# Patient Record
Sex: Male | Born: 1995 | Race: Black or African American | Hispanic: No | Marital: Single | Smoking: Current every day smoker
Health system: Southern US, Community
[De-identification: ages and names within clinical notes are randomized; demographics above are authoritative.]

## PROBLEM LIST (undated history)

## (undated) DIAGNOSIS — T148XXA Other injury of unspecified body region, initial encounter: Secondary | ICD-10-CM

---

## 2014-06-09 ENCOUNTER — Other Ambulatory Visit (HOSPITAL_COMMUNITY)
Admission: RE | Admit: 2014-06-09 | Discharge: 2014-06-09 | Disposition: A | Discharge status: Home / Self Care | Payer: Self-pay | Source: Ambulatory Visit | Attending: Emergency Medicine | Admitting: Emergency Medicine

## 2014-06-09 ENCOUNTER — Emergency Department (INDEPENDENT_AMBULATORY_CARE_PROVIDER_SITE_OTHER)
Admission: EM | Admit: 2014-06-09 | Discharge: 2014-06-09 | Disposition: A | Discharge status: Home / Self Care | Payer: Self-pay | Source: Home / Self Care | Attending: Emergency Medicine | Admitting: Emergency Medicine

## 2014-06-09 ENCOUNTER — Encounter (HOSPITAL_COMMUNITY): Payer: Self-pay | Admitting: Emergency Medicine

## 2014-06-09 DIAGNOSIS — Z202 Contact with and (suspected) exposure to infections with a predominantly sexual mode of transmission: Secondary | ICD-10-CM

## 2014-06-09 DIAGNOSIS — Z113 Encounter for screening for infections with a predominantly sexual mode of transmission: Secondary | ICD-10-CM | POA: Insufficient documentation

## 2014-06-09 NOTE — ED Notes (Signed)
Pt states that he was told by a partner that he was exposed to Herpes.

## 2014-06-09 NOTE — ED Provider Notes (Signed)
CSN: [id number]     Arrival date & time 06/09/14  1203 History   First MD Initiated Contact with Patient 06/09/14 1349     Chief Complaint  Patient presents with  . Exposure to STD   (Consider location/radiation/quality/duration/timing/severity/associated sxs/prior Treatment) HPI Comments: Patient states he was informed by his current girlfriend that she might have herpes. He reports himself to be asymptomatic but [facility].  States he has been treated for chlamydia in the past.   Patient is a 19 y.o. male presenting with STD exposure. The history is provided by the patient.  Exposure to STD This is a new problem.    History reviewed. No pertinent past medical history. History reviewed. No pertinent past surgical history. History reviewed. No pertinent family history. History  Substance Use Topics  . Smoking status: Current Every Day Smoker -- 0.50 packs/day    Types: Cigarettes  . Smokeless tobacco: Not on file  . Alcohol Use: No    Review of Systems  All other systems reviewed and are negative.   Allergies  Review of patient's allergies indicates no known allergies.  Home Medications   Prior to Admission medications   Not on File   BP 123/73 mmHg  Pulse 51  Temp(Src) 98.6 F (37 C) (Oral)  Resp 15  SpO2 100% Physical Exam  Constitutional: He is oriented to person, place, and time. He appears well-developed and well-nourished. No distress.  HENT:  Head: Normocephalic and atraumatic.  Eyes: Conjunctivae are normal.  Cardiovascular: Normal rate.   Pulmonary/Chest: Effort normal.  Abdominal: There is no tenderness.  Genitourinary:  Declines exam  Musculoskeletal: Normal range of motion.  Neurological: He is alert and oriented to person, place, and time.  Skin: Skin is warm and dry.  Psychiatric: He has a normal mood and affect. His behavior is normal.  Nursing note and vitals reviewed.   ED Course  Procedures (including critical  care time) Labs Review Labs Reviewed  HIV ANTIBODY (ROUTINE TESTING)  RPR  HSV 2 ANTIBODY, IGG  HSV 1 ANTIBODY, IGG  URINE CYTOLOGY ANCILLARY ONLY    Imaging Review No results found.   MDM   1. Possible exposure to STD   Advised safe sex practices. Will notify patient if results indicate need for treatment. [facility] concerns   Ria ClockJennifer Birchall H Abbigael Detlefsen, [place] 06/09/14 1451

## 2014-06-09 NOTE — Discharge Instructions (Signed)
If any of your results are positive or indicate the need for treatment, you will be notified by phone.  Safe Sex Safe sex is about reducing the risk of giving or getting a sexually transmitted disease (STD). STDs are spread through sexual contact involving the genitals, mouth, or rectum. Some STDs can be cured and others cannot. Safe sex can also prevent unintended pregnancies.  WHAT ARE SOME SAFE SEX PRACTICES?  Limit your sexual activity to only one partner who is having sex with only you.  Talk to your partner about his or her past partners, past STDs, and drug use.  Use a condom every time you have sexual intercourse. This includes vaginal, oral, and anal sexual activity. Both females and males should wear condoms during oral sex. Only use latex or polyurethane condoms and water-based lubricants. Using petroleum-based lubricants or oils to lubricate a condom will weaken the condom and increase the chance that it will break. The condom should be in place from the beginning to the end of sexual activity. Wearing a condom reduces, but does not completely eliminate, your risk of getting or giving an STD. STDs can be spread by contact with infected body fluids and skin.  Get vaccinated for hepatitis B and HPV.  Avoid alcohol and recreational drugs, which can affect your judgment. You may forget to use a condom or participate in high-risk sex.  For females, avoid douching after sexual intercourse. Douching can spread an infection farther into the reproductive tract.  Check your body for signs of sores, blisters, rashes, or unusual discharge. See your health care provider if you notice any of these signs.  Avoid sexual contact if you have symptoms of an infection or are being treated for an STD. If you or your partner has herpes, avoid sexual contact when blisters are present. Use condoms at all other times.  If you are at risk of being infected with HIV, it is recommended that you take a  prescription medicine daily to prevent HIV infection. This is called pre-exposure prophylaxis (PrEP). You are considered at risk if:  You are a man who has sex with other men (MSM).  You are a heterosexual man or woman who is sexually active with more than one partner.  You take drugs by injection.  You are sexually active with a partner who has HIV.  Talk with your health care provider about whether you are at high risk of being infected with HIV. If you choose to begin PrEP, you should first be tested for HIV. You should then be tested every 3 months for as long as you are taking PrEP.  See your health care provider for regular screenings, exams, and tests for other STDs. Before having sex with a new partner, each of you should be screened for STDs and should talk about the results with each other. WHAT ARE THE BENEFITS OF SAFE SEX?   There is less chance of getting or giving an STD.  You can prevent unwanted or unintended pregnancies.  By discussing safe sex concerns with your partner, you may increase feelings of intimacy, comfort, trust, and honesty between the two of you. Document Released: 05/05/2004 Document Revised: 08/12/2013 Document Reviewed: 09/19/2011 [facility] Patient Information 2015 [place], [place]. This information is not intended to replace advice given to you by your health care provider. Make sure you discuss any questions you have with your health care provider.  Sexually Transmitted Disease A sexually transmitted disease (STD) is a disease or infection that may  be passed (transmitted) from person to person, usually during sexual activity. This may happen by way of saliva, semen, blood, vaginal mucus, or urine. Common STDs include:   Gonorrhea.   Chlamydia.   Syphilis.   HIV and AIDS.   Genital herpes.   Hepatitis B and C.   Trichomonas.   Human papillomavirus (HPV).   Pubic lice.   Scabies.  Mites.  Bacterial vaginosis. WHAT ARE CAUSES  OF STDs? An STD may be caused by bacteria, a virus, or parasites. STDs are often transmitted during sexual activity if one person is infected. However, they may also be transmitted through nonsexual means. STDs may be transmitted after:   Sexual intercourse with an infected person.   Sharing sex toys with an infected person.   Sharing needles with an infected person or using unclean piercing or tattoo needles.  Having intimate contact with the genitals, mouth, or rectal areas of an infected person.   Exposure to infected fluids during birth. WHAT ARE THE SIGNS AND SYMPTOMS OF STDs? Different STDs have different symptoms. Some people may not have any symptoms. If symptoms are present, they may include:   Painful or bloody urination.   Pain in the pelvis, abdomen, vagina, anus, throat, or eyes.   A skin rash, itching, or irritation.  Growths, ulcerations, blisters, or sores in the genital and anal areas.  Abnormal vaginal discharge with or without bad odor.   Penile discharge in men.   Fever.   Pain or bleeding during sexual intercourse.   Swollen glands in the groin area.   Yellow skin and eyes (jaundice). This is seen with hepatitis.   Swollen testicles.  Infertility.  Sores and blisters in the mouth. HOW ARE STDs DIAGNOSED? To make a diagnosis, your health care provider may:   Take a medical history.   Perform a physical exam.   Take a sample of any discharge to examine.  Swab the throat, cervix, opening to the penis, rectum, or vagina for testing.  Test a sample of your first morning urine.   Perform blood tests.   Perform a Pap test, if this applies.   Perform a colposcopy.   Perform a laparoscopy.  HOW ARE STDs TREATED? Treatment depends on the STD. Some STDs may be treated but not cured.   Chlamydia, gonorrhea, trichomonas, and syphilis can be cured with antibiotic medicine.   Genital herpes, hepatitis, and HIV can be treated,  but not cured, with prescribed medicines. The medicines lessen symptoms.   Genital warts from HPV can be treated with medicine or by freezing, burning (electrocautery), or surgery. Warts may come back.   HPV cannot be cured with medicine or surgery. However, abnormal areas may be removed from the cervix, vagina, or vulva.   If your diagnosis is confirmed, your recent sexual partners need treatment. This is true even if they are symptom-free or have a negative culture or evaluation. They should not have sex until their health care providers say it is okay. HOW CAN I REDUCE MY RISK OF GETTING AN STD? Take these steps to reduce your risk of getting an STD:  Use latex condoms, dental dams, and water-soluble lubricants during sexual activity. Do not use petroleum jelly or oils.  Avoid having multiple sex partners.  Do not have sex with someone who has other sex partners.  Do not have sex with anyone you do not know or who is at high risk for an STD.  Avoid risky sex practices that can break your  skin.  Do not have sex if you have open sores on your mouth or skin.  Avoid drinking too much alcohol or taking illegal drugs. Alcohol and drugs can affect your judgment and put you in a vulnerable position.  Avoid engaging in oral and anal sex acts.  Get vaccinated for HPV and hepatitis. If you have not received these vaccines in the past, talk to your health care provider about whether one or both might be right for you.   If you are at risk of being infected with HIV, it is recommended that you take a prescription medicine daily to prevent HIV infection. This is called pre-exposure prophylaxis (PrEP). You are considered at risk if:  You are a man who has sex with other men (MSM).  You are a heterosexual man or woman and are sexually active with more than one partner.  You take drugs by injection.  You are sexually active with a partner who has HIV.  Talk with your health care provider  about whether you are at high risk of being infected with HIV. If you choose to begin PrEP, you should first be tested for HIV. You should then be tested every 3 months for as long as you are taking PrEP.  WHAT SHOULD I DO IF I THINK I HAVE AN STD?  See your health care provider.   Tell your sexual partner(s). They should be tested and treated for any STDs.  Do not have sex until your health care provider says it is okay. WHEN SHOULD I GET IMMEDIATE MEDICAL CARE? Contact your health care provider right away if:   You have severe abdominal pain.  You are a man and notice swelling or pain in your testicles.  You are a woman and notice swelling or pain in your vagina. Document Released: 06/18/2002 Document Revised: 04/02/2013 Document Reviewed: 10/16/2012 [facility] Patient Information 2015 [place], [place]. This information is not intended to replace advice given to you by your health care provider. Make sure you discuss any questions you have with your health care provider.

## 2014-06-10 LAB — HSV 2 ANTIBODY, IGG: HSV 2 Glycoprotein G Ab, IgG: <0.91 index (ref 0.00–0.90)

## 2014-06-10 LAB — RPR: RPR Ser Ql: NONREACTIVE

## 2014-06-10 LAB — HIV ANTIBODY (ROUTINE TESTING W REFLEX): HIV Screen 4th Generation wRfx: NONREACTIVE

## 2014-06-10 LAB — HSV 1 ANTIBODY, IGG: HSV 1 Glycoprotein G Ab, IgG: >62.2 index — ABNORMAL HIGH (ref 0.00–0.90)

## 2014-06-11 ENCOUNTER — Telehealth (HOSPITAL_COMMUNITY): Payer: Self-pay | Admitting: *Deleted

## 2014-06-11 LAB — URINE CYTOLOGY ANCILLARY ONLY
Chlamydia: NEGATIVE
Neisseria Gonorrhea: NEGATIVE
Trichomonas: NEGATIVE

## 2014-06-11 NOTE — ED Notes (Addendum)
Pt. called for his lab results.  Pt. verified x 2 and given results.  Pt. 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  Pt.'s questions about this answered. I told him I would only call back if Cytology tests are pos. Vassie MoselleYork, Jaimee Corum M 06/11/2014 GC/Chlamydia and Trich neg. 06/11/2014

## 2014-10-21 ENCOUNTER — Emergency Department (HOSPITAL_COMMUNITY)
Admission: EM | Admit: 2014-10-21 | Discharge: 2014-10-21 | Disposition: A | Discharge status: Home / Self Care | Payer: Self-pay | Attending: Emergency Medicine | Admitting: Emergency Medicine

## 2014-10-21 ENCOUNTER — Encounter (HOSPITAL_COMMUNITY): Payer: Self-pay | Admitting: Emergency Medicine

## 2014-10-21 DIAGNOSIS — L02416 Cutaneous abscess of left lower limb: Secondary | ICD-10-CM | POA: Insufficient documentation

## 2014-10-21 DIAGNOSIS — Z720 Tobacco use: Secondary | ICD-10-CM | POA: Insufficient documentation

## 2014-10-21 MED ORDER — LIDOCAINE-EPINEPHRINE (PF) 2 %-1:200000 IJ SOLN
10.0000 mL | Freq: Once | INTRAMUSCULAR | Status: AC
  Administered 2014-10-21: 10 mL
  Filled 2014-10-21: qty 20

## 2014-10-21 NOTE — Discharge Instructions (Signed)
Your packing may be removed Thursday morning, this is easier to do while in the shower.  Watch for signs of worsening:  Increasing redness, worsening drainage, or pain.   Abscess Care After An abscess (also called a boil or furuncle) is an infected area that contains a collection of pus. Signs and symptoms of an abscess include pain, tenderness, redness, or hardness, or you may feel a moveable soft area under your skin. An abscess can occur anywhere in the body. The infection may spread to surrounding tissues causing cellulitis. A cut (incision) by the surgeon was made over your abscess and the pus was drained out. Gauze may have been packed into the space to provide a drain that will allow the cavity to heal from the inside outwards. The boil may be painful for 5 to 7 days. Most people with a boil do not have high fevers. Your abscess, if seen early, may not have localized, and may not have been lanced. If not, another appointment may be required for this if it does not get better on its own or with medications. HOME CARE INSTRUCTIONS   Only take over-the-counter or prescription medicines for pain, discomfort, or fever as directed by your caregiver.  When you bathe on Thursday, soak and then remove gauze or iodoform packs at least daily or as directed by your caregiver. You may then wash the wound gently with mild soapy water. Repack with gauze or do as your caregiver directs. SEEK IMMEDIATE MEDICAL CARE IF:   You develop increased pain, swelling, redness, drainage, or bleeding in the wound site.  You develop signs of generalized infection including muscle aches, chills, fever, or a general ill feeling.  An oral temperature above 102 F (38.9 C) develops, not controlled by medication. See your caregiver for a recheck if you develop any of the symptoms described above. If medications (antibiotics) were prescribed, take them as directed. Document Released: 10/14/2004 Document Revised: 06/20/2011  Document Reviewed: 06/11/2007 [facility] Patient Information 2015 [place], [place]. This information is not intended to replace advice given to you by your health care provider. Make sure you discuss any questions you have with your health care provider.

## 2014-10-21 NOTE — ED Notes (Signed)
Dr. Otter at the bedside.  

## 2014-10-21 NOTE — ED Provider Notes (Signed)
CSN: [id number]     Arrival date & time 10/21/14  0050 History  This chart was scribed for Marisa Severinlga Mamoru Takeshita, MD by Abel PrestoKara Demonbreun, ED Scribe. This patient was seen in room D32C/D32C and the patient's care was started at 2:56 AM.    Chief Complaint  Patient presents with  . Abscess      The history is provided by the patient. No language interpreter was used.  HPI Comments: Roger Lane is a 19 y.o. male who presents to the Emergency Department complaining of boil to left upper thigh with onset 7 days ago. Pt states boil produced a whitehead which he popped. He reports worsening swelling and tenderness to area since. He states sitting aggravates the pain. Pt denies fever and chills.    History reviewed. No pertinent past medical history. History reviewed. No pertinent past surgical history. No family history on file. History  Substance Use Topics  . Smoking status: Current Every Day Smoker -- 0.00 packs/day    Types: Cigarettes  . Smokeless tobacco: Not on file  . Alcohol Use: No    Review of Systems  Constitutional: Negative for fever and chills.  Skin:       lesion      Allergies  Review of patient's allergies indicates no known allergies.  Home Medications   Prior to Admission medications   Not on File   BP 134/60 mmHg  Pulse 51  Temp(Src) 97.4 F (36.3 C) (Oral)  Resp 16  Ht 6\' 2"  (1.88 m)  Wt 190 lb 2 oz (86.24 kg)  BMI 24.40 kg/m2  SpO2 100% Physical Exam  Musculoskeletal: He exhibits tenderness (left lateral thigh at abscess).  Skin: Rash noted.  Abscess to left lateral thigh, induration, fluctuance    ED Course  Procedures (including critical care time) DIAGNOSTIC STUDIES: Oxygen Saturation is 100% on room air, normal by my interpretation.    COORDINATION OF CARE: 3:01 AM Discussed treatment plan with patient at beside, the patient agrees with the plan and has no further questions at this time.   Labs Review Labs Reviewed  CULTURE, ROUTINE-ABSCESS     Imaging Review No results found.   EKG Interpretation None     INCISION AND DRAINAGE PROCEDURE NOTE: Patient identification was confirmed and verbal consent was obtained. This procedure was performed by Marisa Severinlga Jakel Alphin, MD at 3:22 AM. Site: left upper thigh Sterile procedures observed Needle size: 25 gauge Anesthetic used (type and amt): lidocaine-EPI 2 % Blade size: 11  Drainage: purulent drainage Complexity: Complex Packing used Site anesthetized, incision made over site, wound drained and explored loculations, rinsed with copious amounts of normal saline, wound packed with sterile gauze, covered with dry, sterile dressing.  Pt tolerated procedure well without complications.  Instructions for care discussed verbally and pt provided with additional written instructions for homecare and f/u.   MDM   Final diagnoses:  Abscess of left thigh   I personally performed the services described in this documentation, which was scribed in my presence. The recorded information has been reviewed and is accurate.  Pt with partially drained abscess, incision and drainage completed.  Pt given return precautions    Marisa Severinlga Mishon Blubaugh, MD 10/21/14 [phone]

## 2014-10-21 NOTE — ED Notes (Signed)
Pt. reports abscess with drainage at left upper thigh onset last week .

## 2014-10-23 LAB — CULTURE, ROUTINE-ABSCESS: Special Requests: NORMAL

## 2014-10-24 ENCOUNTER — Telehealth (HOSPITAL_COMMUNITY): Payer: Self-pay

## 2014-10-24 NOTE — Progress Notes (Signed)
ED Antimicrobial Stewardship Positive Culture Follow Up   Marin Olpydre Seawood is an 19 y.o. male who [facility] with a chief complaint of  Chief Complaint  Patient presents with  . Abscess    Recent Results (from the past 720 hour(s))  Culture, routine-abscess     Status: None   Collection Time: 10/21/14  3:31 AM  Result Value Ref Range Status   Specimen Description ABSCESS LEFT POSTERIOR THIGH  Final   Special Requests Normal  Final   Gram Stain   Final    FEW WBC PRESENT,BOTH PMN AND MONONUCLEAR NO SQUAMOUS EPITHELIAL CELLS SEEN MODERATE GRAM POSITIVE COCCI IN PAIRS IN CLUSTERS Performed at [facility]    Culture   Final    MODERATE STAPHYLOCOCCUS AUREUS Note: RIFAMPIN AND GENTAMICIN SHOULD NOT BE USED AS SINGLE DRUGS FOR TREATMENT OF STAPH INFECTIONS. This organism DOES NOT demonstrate inducible Clindamycin resistance in vitro. Performed at [facility]    Report Status 10/23/2014 FINAL  Final   Organism ID, Bacteria STAPHYLOCOCCUS AUREUS  Final      Susceptibility   Staphylococcus aureus - MIC*    CLINDAMYCIN <=0.25 SENSITIVE Sensitive     ERYTHROMYCIN >=8 RESISTANT Resistant     GENTAMICIN <=0.5 SENSITIVE Sensitive     LEVOFLOXACIN 4 INTERMEDIATE Intermediate     OXACILLIN 0.5 SENSITIVE Sensitive     PENICILLIN >=0.5 RESISTANT Resistant     RIFAMPIN <=0.5 SENSITIVE Sensitive     TRIMETH/SULFA <=10 SENSITIVE Sensitive     VANCOMYCIN 1 SENSITIVE Sensitive     TETRACYCLINE <=1 SENSITIVE Sensitive     MOXIFLOXACIN 2 RESISTANT Resistant     * MODERATE STAPHYLOCOCCUS AUREUS    []  Follow-up phone call  [phone] YOM who presented with a L-upper thigh abscess with drainage - now s/p I&D which is usually sufficient for mild cases.   Follow up: Call for symptom check - If improving >> no antibiotics needed, I&D sufficient - If not improving/worsening >> Keflex 500 mg 4x/day for 7 days  ED Provider: Teressa LowerVrinda Pickering, NP  Rolley SimsMartin, Jabe Jeanbaptiste  Ann 10/24/2014, 8:10 AM Infectious Diseases Pharmacist Phone# [phone]

## 2014-10-24 NOTE — ED Notes (Signed)
Post ED Visit - Positive Culture Follow-up: Chart Hand-off to ED Flow Manager  Culture assessed and recommendations reviewed by: []  Isaac BlissMichael Maccia, Pharm.D., BCPS []  Celedonio MiyamotoJeremy Frens, Pharm.D., BCPS-AQ ID [x]  Georgina PillionElizabeth Martin, [address]Pharm[address]D., BCPS []  Paac CiinakMinh Pham, [address]Pharm[address]D., BCPS, AAHIVP []  Estella HuskMichelle Turner, Pharm .D., BCPS, AAHIVP []  Tennis Mustassie Stewart, Pharm.D.  Positive wound culture  [x]  Patient discharged without antimicrobial prescription and treatment is now indicated []  Organism is resistant to prescribed ED discharge antimicrobial []  Patient with positive blood cultures  Changes discussed with ED provider: Teressa LowerVrinda Pickering FNP New antibiotic prescription if improving no ab needed, if not improving needs keflex 500 mg po 4 x day x 7 days  Spoke with pt. Wound much better, no longer hurting.  No medication called in at this time.    Ashley JacobsFesterman, Orvella Digiulio C 10/24/2014, 11:11 AM

## 2016-07-30 ENCOUNTER — Emergency Department (HOSPITAL_COMMUNITY)
Admission: EM | Admit: 2016-07-30 | Discharge: 2016-07-30 | Disposition: A | Discharge status: Home / Self Care | Payer: Self-pay | Attending: Emergency Medicine | Admitting: Emergency Medicine

## 2016-07-30 ENCOUNTER — Encounter (HOSPITAL_COMMUNITY): Payer: Self-pay | Admitting: Nurse Practitioner

## 2016-07-30 DIAGNOSIS — L02519 Cutaneous abscess of unspecified hand: Secondary | ICD-10-CM

## 2016-07-30 DIAGNOSIS — Z23 Encounter for immunization: Secondary | ICD-10-CM | POA: Insufficient documentation

## 2016-07-30 DIAGNOSIS — L03119 Cellulitis of unspecified part of limb: Secondary | ICD-10-CM

## 2016-07-30 DIAGNOSIS — L02414 Cutaneous abscess of left upper limb: Secondary | ICD-10-CM | POA: Insufficient documentation

## 2016-07-30 DIAGNOSIS — F17210 Nicotine dependence, cigarettes, uncomplicated: Secondary | ICD-10-CM | POA: Insufficient documentation

## 2016-07-30 MED ORDER — LIDOCAINE HCL (PF) 1 % IJ SOLN
10.0000 mL | Freq: Once | INTRAMUSCULAR | Status: AC
  Administered 2016-07-30: 10 mL
  Filled 2016-07-30: qty 10

## 2016-07-30 MED ORDER — TETANUS-DIPHTH-ACELL PERTUSSIS 5-2.5-18.5 LF-MCG/0.5 IM SUSP
0.5000 mL | Freq: Once | INTRAMUSCULAR | Status: AC
  Administered 2016-07-30: 0.5 mL via INTRAMUSCULAR
  Filled 2016-07-30: qty 0.5

## 2016-07-30 MED ORDER — SULFAMETHOXAZOLE-TRIMETHOPRIM 800-160 MG PO TABS: 1.0000 | ORAL_TABLET | Freq: Two times a day (BID) | ORAL | 0 refills | Status: AC

## 2016-07-30 MED ORDER — IBUPROFEN 800 MG PO TABS: 800.0000 mg | ORAL_TABLET | Freq: Three times a day (TID) | ORAL | 0 refills | Status: DC

## 2016-07-30 NOTE — ED Notes (Signed)
ED Provider at bedside. 

## 2016-07-30 NOTE — ED Triage Notes (Signed)
Pt presents with c/o L hand pain. The pain began this morning when he woke. He was using a razor to cut a callous off the web of his hand between his thumb and second digit yesterday. When he woke today the area was red, inflamed and painful.

## 2016-07-30 NOTE — ED Provider Notes (Signed)
[facility]-EMERGENCY DEPT Provider Note   CSN: [id number] Arrival date & time: 07/30/16  1211  By signing my name below, I, Karis Emig, attest that this documentation has been prepared under the direction and in the presence of Cristina Gong, [place] . Electronically Signed: Majel Homer, ED Scribe. 07/30/2016. 4:09 PM.  History   Chief Complaint Chief Complaint  Patient presents with  . Hand Pain   The history is provided by the patient. No language interpreter was used.   HPI Comments: Roger Lane is a 21 y.o. male who presents to the Emergency Department complaining of gradually worsening, left hand pain that began 2 days ago. Pt reports he used a dirty razor blade to "cut a callous" off his hand in the webspace between his left thumb and second digit 2 days ago. He states he then noticed mild swelling to the area yesterday and woke up this morning with increased swelling, redness, and warmth to his hand. He notes he has applied topical antibiotic ointment to his hand with no relief of his symptoms. Pt denies any drainage from his site of pain, numbness in his left hand, fever, or chills.  He is unsure of his last tetanus vaccination.   History reviewed. No pertinent past medical history.  There are no active problems to display for this patient.  History reviewed. No pertinent surgical history.  Home Medications    Prior to Admission medications   Medication Sig Start Date End Date Taking? Authorizing Provider  ibuprofen (ADVIL,MOTRIN) 800 MG tablet Take 1 tablet (800 mg total) by mouth 3 (three) times daily. 07/30/16   Cristina Gong, PA-C  sulfamethoxazole-trimethoprim (BACTRIM DS,SEPTRA DS) 800-160 MG tablet Take 1 tablet by mouth 2 (two) times daily. 07/30/16 08/06/16  Cristina Gong, PA-C    Family History History reviewed. No pertinent family history.  Social History Social History  Substance Use Topics  . Smoking status: Current Every Day Smoker    Packs/day: 0.00    Types: Cigarettes  . Smokeless tobacco: Never Used  . Alcohol use Yes   Allergies   Patient has no known allergies.  Review of Systems Review of Systems  Constitutional: Negative for chills, fatigue and fever.  Gastrointestinal: Negative for nausea and vomiting.  Musculoskeletal: Negative for arthralgias and joint swelling.  Skin: Positive for color change and wound.       +swelling, redness, and warmth to the webspace between his left thumb and second digit   Neurological: Negative for numbness.   Physical Exam Updated Vital Signs BP (!) 140/51   Pulse 66   Temp 98.7 F (37.1 C) (Oral)   Resp 17   SpO2 100%   Physical Exam  Constitutional: He appears well-developed and well-nourished.  HENT:  Head: Normocephalic.  Eyes: Conjunctivae are normal.  Neck: Normal range of motion.  Cardiovascular: Normal rate.   Pulmonary/Chest: Effort normal. No respiratory distress.  Abdominal: He exhibits no distension.  Musculoskeletal: Normal range of motion.  Neurological: He is alert.  Skin: There is erythema.  4 cm fluctuant area that wraps around both dorsal and ventral aspects of the webspace between his thumb and index finger. Red and tender.   Psychiatric: He has a normal mood and affect. His behavior is normal.  Nursing note and vitals reviewed.  ED Treatments / Results  DIAGNOSTIC STUDIES:  Oxygen Saturation is 100% on RA, normal by my interpretation.    COORDINATION OF CARE:  4:06 PM Discussed treatment plan with pt at bedside which  includes [place] bedside, possible I&D, and Bactrim and pt agreed to plan.  Labs (all labs ordered are listed, but only abnormal results are displayed) Labs Reviewed - No data to display  EKG  EKG Interpretation None       Radiology No results found.  Procedures .Marland KitchenIncision and Drainage Date/Time: 07/30/2016 4:09 PM Performed by: Cristina Gong Authorized by: Cristina Gong   Consent:    Consent obtained:  Verbal    Consent given by:  Patient   Risks discussed:  Bleeding, incomplete drainage, infection and pain   Alternatives discussed:  No treatment, delayed treatment, observation and referral Location:    Type:  Abscess   Size:  4cm   Location:  Upper extremity   Upper extremity location:  Hand   Hand location:  L hand Pre-procedure details:    Skin preparation:  Chloraprep Anesthesia (see MAR for exact dosages):    Anesthesia method:  Local infiltration   Local anesthetic:  Lidocaine 2% w/o epi Procedure type:    Complexity:  Simple Procedure details:    Incision types:  Stab incision   Incision depth:  Dermal   Scalpel blade:  11   Wound management:  Probed and deloculated and irrigated with saline   Drainage:  Serosanguinous and purulent   Drainage amount:  Moderate   Wound treatment:  Wound left open   Packing materials:  None Post-procedure details:    Patient tolerance of procedure:  Tolerated well, no immediate complications Comments:     Tetanus updated.   Before and after the procedure the motor function,strength, sensation, and circulatory function was assessed.  After the procedure, patient had intact motor function with 5/5 strength (equal to unaffected side) to all joints on his thumb, index finger, and MCP joints. Each joint was individually tested after the procedure completion.  Sensation intact to distal thumb and index finger.      (including critical care time)  Medications Ordered in ED Medications  lidocaine (PF) (XYLOCAINE) 1 % injection 10 mL (10 mLs Infiltration Given 07/30/16 1629)  Tdap (BOOSTRIX) injection 0.5 mL (0.5 mLs Intramuscular Given 07/30/16 1630)    Initial Impression / Assessment and Plan / ED Course  I have reviewed the triage vital signs and the nursing notes.  Pertinent labs & imaging results that were available during my care of the patient were reviewed by me and considered in my medical decision making (see chart for details).     Patient  with skin abscess. Incision and drainage performed in the ED today.  Abscess was not large enough to warrant packing or drain placement. Wound recheck in 2 days. Supportive care and return precautions discussed.  Pt sent home with Bactrim. The patient appears reasonably screened and/or stabilized for discharge and I doubt any other emergent medical condition requiring further screening, evaluation, or treatment in the ED prior to discharge.  I personally performed the services described in this documentation, which was scribed in my presence. The recorded information has been reviewed and is accurate.   Final Clinical Impressions(s) / ED Diagnoses   Final diagnoses:  Cellulitis and abscess of hand  Need for prophylactic vaccination against diphtheria and tetanus    New Prescriptions Discharge Medication List as of 07/30/2016  5:19 PM    START taking these medications   Details  ibuprofen (ADVIL,MOTRIN) 800 MG tablet Take 1 tablet (800 mg total) by mouth 3 (three) times daily., Starting Sat 07/30/2016, Print    sulfamethoxazole-trimethoprim (BACTRIM DS,SEPTRA DS)  800-160 MG tablet Take 1 tablet by mouth 2 (two) times daily., Starting Sat 07/30/2016, Until Sat 08/06/2016, Print         Cristina Gong, PA-C 07/30/16 1930    Melene Plan, DO 07/31/16 [phone]

## 2018-03-05 ENCOUNTER — Emergency Department (HOSPITAL_COMMUNITY)
Admission: EM | Admit: 2018-03-05 | Discharge: 2018-03-06 | Disposition: A | Discharge status: Home / Self Care | Payer: Self-pay | Attending: Emergency Medicine | Admitting: Emergency Medicine

## 2018-03-05 ENCOUNTER — Encounter (HOSPITAL_COMMUNITY): Payer: Self-pay | Admitting: Emergency Medicine

## 2018-03-05 ENCOUNTER — Other Ambulatory Visit: Payer: Self-pay

## 2018-03-05 DIAGNOSIS — Y999 Unspecified external cause status: Secondary | ICD-10-CM | POA: Insufficient documentation

## 2018-03-05 DIAGNOSIS — Y9367 Activity, basketball: Secondary | ICD-10-CM | POA: Insufficient documentation

## 2018-03-05 DIAGNOSIS — W1839XA Other fall on same level, initial encounter: Secondary | ICD-10-CM | POA: Insufficient documentation

## 2018-03-05 DIAGNOSIS — Y929 Unspecified place or not applicable: Secondary | ICD-10-CM | POA: Insufficient documentation

## 2018-03-05 DIAGNOSIS — S62306A Unspecified fracture of fifth metacarpal bone, right hand, initial encounter for closed fracture: Secondary | ICD-10-CM | POA: Insufficient documentation

## 2018-03-05 DIAGNOSIS — F17210 Nicotine dependence, cigarettes, uncomplicated: Secondary | ICD-10-CM | POA: Insufficient documentation

## 2018-03-05 DIAGNOSIS — Z79899 Other long term (current) drug therapy: Secondary | ICD-10-CM | POA: Insufficient documentation

## 2018-03-05 NOTE — ED Triage Notes (Signed)
Pt states he hurt his right hand while playing basketball 4 days ago. Reports that he has been putting ice on it at home put has continued to have pain and swelling. Right hand is swollen currently. Denies pain unless he is putting strain on it. ROM present.

## 2018-03-06 ENCOUNTER — Emergency Department (HOSPITAL_COMMUNITY): Payer: Self-pay

## 2018-03-06 NOTE — ED Notes (Signed)
Ortho at bedside for splint.

## 2018-03-06 NOTE — ED Notes (Signed)
Patient Alert and oriented to baseline. Stable and ambulatory to baseline. Patient verbalized understanding of the discharge instructions.  Patient belongings were taken by the patient.   

## 2018-03-06 NOTE — ED Provider Notes (Signed)
[facility] EMER[facility]ENC[facility] Provider Note   CSN: [id number] Arrival date & time: 03/05/18  2343     History   Chief Complaint Chief Complaint  Patient presents with  . Hand Injury    HPI Roger Lane is a 22 y.o. male.   22 year old male presents to the emergency department for evaluation of right hand pain.  He states that he fell while playing basketball 4 days ago.  He has had pain with movement as well as persistent swelling.  Has tried icing his hand with little improvement.  Denies any prior hand injury or broken bone.  Patient is right-hand dominant.  He has not had any numbness or tingling.  No limited range of motion of digits.     History reviewed. No pertinent past medical history.  There are no active problems to display for this patient.   History reviewed. No pertinent surgical history.      Home Medications    Prior to Admission medications   Medication Sig Start Date End Date Taking? Authorizing Provider  ibuprofen (ADVIL,MOTRIN) 800 MG tablet Take 1 tablet (800 mg total) by mouth 3 (three) times daily. 07/30/16   Cristina GongHammond, Elizabeth W, PA-C    Family History No family history on file.  Social History Social History   Tobacco Use  . Smoking status: Current Every Day Smoker    Packs/day: 0.00    Types: Cigarettes  . Smokeless tobacco: Never Used  Substance Use Topics  . Alcohol use: Yes  . Drug use: Yes    Types: Marijuana     Allergies   Patient has no known allergies.   Review of Systems Review of Systems Ten systems reviewed and are negative for acute change, except as noted in the HPI.    Physical Exam Updated Vital Signs BP 104/61 (BP Location: Left Arm)   Pulse 73   Temp 98.4 F (36.9 C) (Oral)   Resp 18   SpO2 100%   Physical Exam  Constitutional: He is oriented to person, place, and time. He appears well-developed and well-nourished. No distress.  Nontoxic appearing and in NAD  HENT:  Head:  Normocephalic and atraumatic.  Eyes: Conjunctivae and EOM are normal. No scleral icterus.  Neck: Normal range of motion.  Cardiovascular: Normal rate, regular rhythm and intact distal pulses.  Pulmonary/Chest: Effort normal. No respiratory distress.  Respirations even and unlabored  Musculoskeletal: Normal range of motion. He exhibits tenderness.  TTP to the right hand associated with the 5th metacarpal. No deformity. Moderate swelling.  Neurological: He is alert and oriented to person, place, and time. He exhibits normal muscle tone. Coordination normal.  Sensation to light touch intact. Patient able to wiggle all fingers.  Skin: Skin is warm and dry. No rash noted. He is not diaphoretic. No erythema. No pallor.  Psychiatric: He has a normal mood and affect. His behavior is normal.  Nursing note and vitals reviewed.    ED Treatments / Results  Labs (all labs ordered are listed, but only abnormal results are displayed) Labs Reviewed - No data to display  EKG None  Radiology Dg Hand Complete Right  Result Date: 03/06/2018 CLINICAL DATA:  Injury playing basketball 4 days ago. Right hand pain and swelling. Initial encounter. EXAM: RIGHT HAND - COMPLETE 3+ VIEW COMPARISON:  None. FINDINGS: Oblique fracture of the distal 5th metacarpal is seen with mild volar angulation of the distal fracture fragment. No other fractures identified. No evidence of dislocation. IMPRESSION: Distal 5th  metacarpal fracture with mild volar angulation. Electronically Signed   By: Myles Rosenthal M.D.   On: 03/06/2018 01:06    Procedures Procedures (including critical care time)  Medications Ordered in ED Medications - No data to display   Initial Impression / Assessment and Plan / ED Course  I have reviewed the triage vital signs and the nursing notes.  Pertinent labs & imaging results that were available during my care of the patient were reviewed by me and considered in my medical decision making (see  chart for details).     22 year old male presents to the emergency department for evaluation of right hand pain after a fall playing basketball 4 days ago.  Patient is right-hand dominant.  He is neurovascularly intact on exam.  Some swelling noted along the ulnar aspect of the right hand.  X-ray findings notable for distal fifth metacarpal fracture with mild volar angulation.  He was placed in a ulnar gutter splint.  Will discharge with instruction for hand surgery follow-up.  Supportive care advised with return if symptoms worsen.  Patient discharged in stable condition with no unaddressed concerns.   Final Clinical Impressions(s) / ED Diagnoses   Final diagnoses:  Unspecified fracture of fifth metacarpal bone, right hand, initial encounter for closed fracture    ED Discharge Orders    None       Antony Madura, PA-C 03/06/18 0116    Zadie Rhine, MD 03/06/18 [phone]

## 2018-03-06 NOTE — Progress Notes (Signed)
Orthopedic Tech Progress Note Patient Details:  Roger Lane 02/15/1996 [id number]  Ortho Devices Type of Ortho Device: Ulna gutter splint Ortho Device/Splint Interventions: Ordered, Application   Post Interventions Patient Tolerated: Well Instructions Provided: Adjustment of device, Care of device   Norva KarvonenWalls, Lucia Harm T 03/06/2018, 12:50 AM

## 2018-03-06 NOTE — Discharge Instructions (Signed)
Keep your splint intact until you follow-up with a specialist; do not remove it.  Call the office of Dr. Mina MarbleWeingold in the morning to schedule close outpatient follow-up with hand surgery.  Continue with Tylenol or ibuprofen for pain control.  Ice over top of your splint to help with inflammation.  Return to the emergency department for new or concerning symptoms.

## 2018-03-13 ENCOUNTER — Other Ambulatory Visit: Payer: Self-pay | Admitting: Orthopedic Surgery

## 2018-03-13 ENCOUNTER — Other Ambulatory Visit: Payer: Self-pay

## 2018-03-13 ENCOUNTER — Encounter (HOSPITAL_COMMUNITY): Payer: Self-pay | Admitting: *Deleted

## 2018-03-13 NOTE — Progress Notes (Signed)
Pt denies SOB, chest pain, and being under the care of a cardiologist. Pt denies having a stress test, echo and cardiac cath. Pt denies having an EKG and chest x ray within the last year. Pt denies recent labs. Pt made aware to stop taking vitamins, fish oil and herbal medications. Do not take any NSAIDs ie: Ibuprofen, Advil, Naproxen (Aleve), Motrin, BC and Goody Powder. Pt verbalized understanding of all pre-op instructions. 

## 2018-03-14 ENCOUNTER — Ambulatory Visit (HOSPITAL_COMMUNITY)
Admission: RE | Admit: 2018-03-14 | Discharge: 2018-03-14 | Disposition: A | Discharge status: Home / Self Care | Payer: Self-pay | Source: Ambulatory Visit | Attending: Orthopedic Surgery | Admitting: Orthopedic Surgery

## 2018-03-14 ENCOUNTER — Encounter (HOSPITAL_COMMUNITY)
Admission: RE | Disposition: A | Discharge status: Home / Self Care | Payer: Self-pay | Source: Ambulatory Visit | Attending: Orthopedic Surgery

## 2018-03-14 ENCOUNTER — Ambulatory Visit (HOSPITAL_COMMUNITY): Payer: Self-pay | Admitting: Anesthesiology

## 2018-03-14 ENCOUNTER — Encounter (HOSPITAL_COMMUNITY): Payer: Self-pay | Admitting: Surgery

## 2018-03-14 DIAGNOSIS — X58XXXA Exposure to other specified factors, initial encounter: Secondary | ICD-10-CM | POA: Insufficient documentation

## 2018-03-14 DIAGNOSIS — F17210 Nicotine dependence, cigarettes, uncomplicated: Secondary | ICD-10-CM | POA: Insufficient documentation

## 2018-03-14 DIAGNOSIS — S62306A Unspecified fracture of fifth metacarpal bone, right hand, initial encounter for closed fracture: Secondary | ICD-10-CM | POA: Insufficient documentation

## 2018-03-14 HISTORY — DX: Other injury of unspecified body region, initial encounter: T14.8XXA

## 2018-03-14 HISTORY — PX: OPEN REDUCTION INTERNAL FIXATION (ORIF) METACARPAL: SHX6234

## 2018-03-14 LAB — COMPREHENSIVE METABOLIC PANEL
ALBUMIN: 3.9 g/dL (ref 3.5–5.0)
ALT: 19 U/L (ref 0–44)
AST: 23 U/L (ref 15–41)
Alkaline Phosphatase: 66 U/L (ref 38–126)
Anion gap: 11 (ref 5–15)
BUN: 15 mg/dL (ref 6–20)
CALCIUM: 9.4 mg/dL (ref 8.9–10.3)
CO2: 24 mmol/L (ref 22–32)
Chloride: 103 mmol/L (ref 98–111)
Creatinine, Ser: 1.03 mg/dL (ref 0.61–1.24)
GFR calc Af Amer: >60 mL/min (ref >60)
Glucose, Bld: 102 mg/dL — ABNORMAL HIGH (ref 70–99)
Potassium: 3.7 mmol/L (ref 3.5–5.1)
Sodium: 138 mmol/L (ref 135–145)
Total Bilirubin: 0.5 mg/dL (ref 0.3–1.2)
Total Protein: 6.9 g/dL (ref 6.5–8.1)

## 2018-03-14 LAB — HEMOGLOBIN: Hemoglobin: 14.6 g/dL (ref 13.0–17.0)

## 2018-03-14 SURGERY — OPEN REDUCTION INTERNAL FIXATION (ORIF) METACARPAL
Anesthesia: General | Site: Hand | Laterality: Right

## 2018-03-14 MED ORDER — CEFAZOLIN SODIUM-DEXTROSE 2-4 GM/100ML-% IV SOLN
2.0000 g | INTRAVENOUS | Status: AC
  Administered 2018-03-14: 2 g via INTRAVENOUS

## 2018-03-14 MED ORDER — LIDOCAINE 2% (20 MG/ML) 5 ML SYRINGE
INTRAMUSCULAR | Status: DC | PRN
  Administered 2018-03-14: 80 mg via INTRAVENOUS

## 2018-03-14 MED ORDER — MEPERIDINE HCL 50 MG/ML IJ SOLN: 6.2500 mg | INTRAMUSCULAR | Status: DC | PRN

## 2018-03-14 MED ORDER — FENTANYL CITRATE (PF) 100 MCG/2ML IJ SOLN
INTRAMUSCULAR | Status: DC | PRN
  Administered 2018-03-14 (×3): 50 ug via INTRAVENOUS

## 2018-03-14 MED ORDER — BUPIVACAINE HCL (PF) 0.25 % IJ SOLN
INTRAMUSCULAR | Status: DC | PRN
  Administered 2018-03-14: 10 mL

## 2018-03-14 MED ORDER — CHLORHEXIDINE GLUCONATE 4 % EX LIQD: 60.0000 mL | Freq: Once | CUTANEOUS | Status: DC

## 2018-03-14 MED ORDER — MIDAZOLAM HCL 5 MG/5ML IJ SOLN
INTRAMUSCULAR | Status: DC | PRN
  Administered 2018-03-14: 2 mg via INTRAVENOUS

## 2018-03-14 MED ORDER — HYDROCODONE-ACETAMINOPHEN 7.5-325 MG PO TABS
1.0000 | ORAL_TABLET | Freq: Once | ORAL | Status: AC | PRN
  Administered 2018-03-14: 1 via ORAL

## 2018-03-14 MED ORDER — 0.9 % SODIUM CHLORIDE (POUR BTL) OPTIME
TOPICAL | Status: DC | PRN
  Administered 2018-03-14: 1000 mL

## 2018-03-14 MED ORDER — PROPOFOL 10 MG/ML IV BOLUS
INTRAVENOUS | Status: DC | PRN
  Administered 2018-03-14: 200 mg via INTRAVENOUS
  Administered 2018-03-14 (×2): 30 mg via INTRAVENOUS

## 2018-03-14 MED ORDER — OXYCODONE-ACETAMINOPHEN 5-325 MG PO TABS: 1.0000 | ORAL_TABLET | ORAL | 0 refills | Status: AC | PRN

## 2018-03-14 MED ORDER — ONDANSETRON HCL 4 MG/2ML IJ SOLN
INTRAMUSCULAR | Status: DC | PRN
  Administered 2018-03-14: 4 mg via INTRAVENOUS

## 2018-03-14 MED ORDER — ONDANSETRON HCL 4 MG/2ML IJ SOLN
INTRAMUSCULAR | Status: AC
  Filled 2018-03-14: qty 2

## 2018-03-14 MED ORDER — HYDROCODONE-ACETAMINOPHEN 7.5-325 MG PO TABS
ORAL_TABLET | ORAL | Status: AC
  Administered 2018-03-14: 1 via ORAL
  Filled 2018-03-14: qty 1

## 2018-03-14 MED ORDER — MIDAZOLAM HCL 2 MG/2ML IJ SOLN
INTRAMUSCULAR | Status: AC
  Filled 2018-03-14: qty 2

## 2018-03-14 MED ORDER — LACTATED RINGERS IV SOLN
INTRAVENOUS | Status: DC
  Administered 2018-03-14: 11:00:00 via INTRAVENOUS

## 2018-03-14 MED ORDER — HYDROMORPHONE HCL 1 MG/ML IJ SOLN: 0.2500 mg | INTRAMUSCULAR | Status: DC | PRN

## 2018-03-14 MED ORDER — LIDOCAINE 2% (20 MG/ML) 5 ML SYRINGE
INTRAMUSCULAR | Status: AC
  Filled 2018-03-14: qty 5

## 2018-03-14 MED ORDER — PROPOFOL 10 MG/ML IV BOLUS
INTRAVENOUS | Status: AC
  Filled 2018-03-14: qty 20

## 2018-03-14 MED ORDER — CEFAZOLIN SODIUM-DEXTROSE 2-4 GM/100ML-% IV SOLN
INTRAVENOUS | Status: AC
  Filled 2018-03-14: qty 100

## 2018-03-14 MED ORDER — ONDANSETRON HCL 4 MG/2ML IJ SOLN: 4.0000 mg | Freq: Once | INTRAMUSCULAR | Status: DC | PRN

## 2018-03-14 MED ORDER — BUPIVACAINE HCL (PF) 0.25 % IJ SOLN
INTRAMUSCULAR | Status: AC
  Filled 2018-03-14: qty 30

## 2018-03-14 MED ORDER — FENTANYL CITRATE (PF) 250 MCG/5ML IJ SOLN
INTRAMUSCULAR | Status: AC
  Filled 2018-03-14: qty 5

## 2018-03-14 SURGICAL SUPPLY — 67 items
APL SKNCLS STERI-STRIP NONHPOA (GAUZE/BANDAGES/DRESSINGS) ×1
BANDAGE ACE 3X5.8 VEL STRL LF (GAUZE/BANDAGES/DRESSINGS) IMPLANT
BANDAGE ACE 4X5 VEL STRL LF (GAUZE/BANDAGES/DRESSINGS) ×3 IMPLANT
BANDAGE ELASTIC 4 VELCRO ST LF (GAUZE/BANDAGES/DRESSINGS) ×3 IMPLANT
BENZOIN TINCTURE PRP APPL 2/3 (GAUZE/BANDAGES/DRESSINGS) ×3 IMPLANT
BIT DRILL 1.5 (BIT) ×1
BIT DRILL 1.5MM (BIT) ×1 IMPLANT
BNDG CMPR 9X4 STRL LF SNTH (GAUZE/BANDAGES/DRESSINGS) ×1
BNDG ESMARK 4X9 LF (GAUZE/BANDAGES/DRESSINGS) ×3 IMPLANT
BNDG GAUZE ELAST 4 BULKY (GAUZE/BANDAGES/DRESSINGS) ×3 IMPLANT
CLOSURE WOUND 1/2 X4 (GAUZE/BANDAGES/DRESSINGS) ×1
CORDS BIPOLAR (ELECTRODE) ×3 IMPLANT
COVER SURGICAL LIGHT HANDLE (MISCELLANEOUS) IMPLANT
COVER WAND RF STERILE (DRAPES) IMPLANT
CUFF TOURNIQUET SINGLE 18IN (TOURNIQUET CUFF) ×3 IMPLANT
CUFF TOURNIQUET SINGLE 24IN (TOURNIQUET CUFF) IMPLANT
DRAPE OEC MINIVIEW 54X84 (DRAPES) ×3 IMPLANT
DRAPE SURG 17X23 STRL (DRAPES) ×3 IMPLANT
DRILL BIT 1.5MM (BIT) ×3
DURAPREP 26ML APPLICATOR (WOUND CARE) ×3 IMPLANT
ELECT REM PT RETURN 9FT ADLT (ELECTROSURGICAL)
ELECTRODE REM PT RTRN 9FT ADLT (ELECTROSURGICAL) IMPLANT
GAUZE SPONGE 4X4 12PLY STRL (GAUZE/BANDAGES/DRESSINGS) ×3 IMPLANT
GAUZE XEROFORM 1X8 LF (GAUZE/BANDAGES/DRESSINGS) IMPLANT
GLOVE BIOGEL PI IND STRL 6.5 (GLOVE) ×2 IMPLANT
GLOVE BIOGEL PI INDICATOR 6.5 (GLOVE) ×4
GLOVE SS BIOGEL STRL SZ 6.5 (GLOVE) ×2 IMPLANT
GLOVE SUPERSENSE BIOGEL SZ 6.5 (GLOVE) ×4
GLOVE SURG SYN 8.0 (GLOVE) ×3 IMPLANT
GOWN STRL REUS W/ TWL LRG LVL3 (GOWN DISPOSABLE) ×1 IMPLANT
GOWN STRL REUS W/ TWL XL LVL3 (GOWN DISPOSABLE) ×1 IMPLANT
GOWN STRL REUS W/TWL LRG LVL3 (GOWN DISPOSABLE) ×3
GOWN STRL REUS W/TWL XL LVL3 (GOWN DISPOSABLE) ×3
KIT BASIN OR (CUSTOM PROCEDURE TRAY) ×3 IMPLANT
KIT TURNOVER KIT B (KITS) ×3 IMPLANT
MANIFOLD NEPTUNE II (INSTRUMENTS) IMPLANT
NEEDLE HYPO 25GX1X1/2 BEV (NEEDLE) IMPLANT
NEEDLE HYPO 25X1 1.5 SAFETY (NEEDLE) IMPLANT
NS IRRIG 1000ML POUR BTL (IV SOLUTION) ×3 IMPLANT
PACK ORTHO EXTREMITY (CUSTOM PROCEDURE TRAY) ×3 IMPLANT
PAD ARMBOARD 7.5X6 YLW CONV (MISCELLANEOUS) ×6 IMPLANT
PAD CAST 3X4 CTTN HI CHSV (CAST SUPPLIES) IMPLANT
PAD CAST 4YDX4 CTTN HI CHSV (CAST SUPPLIES) ×1 IMPLANT
PADDING CAST COTTON 3X4 STRL (CAST SUPPLIES)
PADDING CAST COTTON 4X4 STRL (CAST SUPPLIES) ×3
PENCIL BUTTON HOLSTER BLD 10FT (ELECTRODE) IMPLANT
PLATE STR 6H 29MM (Plate) ×3 IMPLANT
SCREW 1.5X10MM (Screw) ×3 IMPLANT
SCREW 1.5X11MM (Screw) ×3 IMPLANT
SCREW 1.5X12MM (Screw) ×3 IMPLANT
SCREW 1.5X8MM (Screw) ×3 IMPLANT
SCREW 1.5X9MM (Screw) ×6 IMPLANT
SCREW BN 9X1.5X3XST CRFRM (Screw) ×2 IMPLANT
SPLINT PLASTER CAST XFAST 4X15 (CAST SUPPLIES) ×1 IMPLANT
SPLINT PLASTER XTRA FAST SET 4 (CAST SUPPLIES) ×2
SPONGE LAP 4X18 RFD (DISPOSABLE) ×6 IMPLANT
STRIP CLOSURE SKIN 1/2X4 (GAUZE/BANDAGES/DRESSINGS) ×2 IMPLANT
SUT PROLENE 3 0 PS 2 (SUTURE) ×3 IMPLANT
SUT VIC AB 3-0 FS2 27 (SUTURE) IMPLANT
SUT VICRYL 4-0 PS2 18IN ABS (SUTURE) ×3 IMPLANT
SYR CONTROL 10ML LL (SYRINGE) ×3 IMPLANT
TOWEL OR 17X24 6PK STRL BLUE (TOWEL DISPOSABLE) ×3 IMPLANT
TOWEL OR 17X26 10 PK STRL BLUE (TOWEL DISPOSABLE) ×3 IMPLANT
TUBE CONNECTING 12'X1/4 (SUCTIONS) ×1
TUBE CONNECTING 12X1/4 (SUCTIONS) ×2 IMPLANT
UNDERPAD 30X30 (UNDERPADS AND DIAPERS) ×3 IMPLANT
WATER STERILE IRR 1000ML POUR (IV SOLUTION) ×3 IMPLANT

## 2018-03-14 NOTE — Anesthesia Procedure Notes (Signed)
Procedure Name: LMA Insertion Date/Time: 03/14/2018 11:30 AM Performed by: Quentin OreWalker, Naoko Diperna E, CRNA Pre-anesthesia Checklist: Patient identified, Emergency Drugs available, Suction available and Patient being monitored Patient Re-evaluated:Patient Re-evaluated prior to induction Oxygen Delivery Method: Circle system utilized Preoxygenation: Pre-oxygenation with 100% oxygen Induction Type: IV induction LMA: LMA inserted LMA Size: 4.0 Number of attempts: 1 Placement Confirmation: positive ETCO2 and breath sounds checked- equal and bilateral Tube secured with: Tape Dental Injury: Teeth and Oropharynx as per pre-operative assessment

## 2018-03-14 NOTE — Anesthesia Preprocedure Evaluation (Signed)
Anesthesia Evaluation  Patient identified by MRN, date of birth, ID band Patient awake    Reviewed: Allergy & Precautions, NPO status , Patient's Chart, lab work & pertinent test results  Airway Mallampati: II  TM Distance: >3 FB Neck ROM: Full    Dental  (+) Teeth Intact, Chipped,    Pulmonary Current Smoker,    Pulmonary exam normal breath sounds clear to auscultation       Cardiovascular negative cardio ROS Normal cardiovascular exam Rhythm:Regular Rate:Normal     Neuro/Psych negative neurological ROS  negative psych ROS   GI/Hepatic negative GI ROS, (+)     substance abuse  marijuana use,   Endo/Other    Renal/GU negative Renal ROS  negative genitourinary   Musculoskeletal Fx right 5th metacarpal   Abdominal   Peds  Hematology negative hematology ROS (+)   Anesthesia Other Findings   Reproductive/Obstetrics                             Anesthesia Physical Anesthesia Plan  ASA: II  Anesthesia Plan: General   Post-op Pain Management:    Induction: Intravenous  PONV Risk Score and Plan: 2 and Ondansetron, Midazolam and Treatment may vary due to age or medical condition  Airway Management Planned: LMA  Additional Equipment:   Intra-op Plan:   Post-operative Plan: Extubation in OR  Informed Consent: I have reviewed the patients History and Physical, chart, labs and discussed the procedure including the risks, benefits and alternatives for the proposed anesthesia with the patient or authorized representative who has indicated his/her understanding and acceptance.   Dental advisory given  Plan Discussed with: CRNA and Surgeon  Anesthesia Plan Comments:         Anesthesia Quick Evaluation

## 2018-03-14 NOTE — Anesthesia Postprocedure Evaluation (Signed)
Anesthesia Post Note  Patient: Roger Lane  Procedure(s) Performed: OPEN REDUCTION INTERNAL FIXATION RIGHT SMALL  METACARPAL FRACTURE (Right Hand)     Patient location during evaluation: PACU Anesthesia Type: General Level of consciousness: awake and alert and oriented Pain management: pain level controlled Vital Signs Assessment: post-procedure vital signs reviewed and stable Respiratory status: spontaneous breathing, nonlabored ventilation and respiratory function stable Cardiovascular status: blood pressure returned to baseline and stable Postop Assessment: no apparent nausea or vomiting Anesthetic complications: no    Last Vitals:  Vitals:   03/14/18 1240 03/14/18 1310  BP: (!) 144/79   Pulse: 90   Resp: (!) 21   Temp: (!) 36.4 C 37.2 C  SpO2: 100%     Last Pain:  Vitals:   03/14/18 1310  TempSrc:   PainSc: 0-No pain                 Flornce Record A.

## 2018-03-14 NOTE — Op Note (Signed)
NAMMarin Lane: Mellor, Brahim MEDICAL RECORD [id number] ACCOUNT [id number] DATE OF BIRTH:[birth year] FACILITY: [facility] LOCATION: [facility]-PER[facility] PHYSICIAN:Alessandria Henken A. Mina MarbleWEINGOLD, MD  OPERATIVE REPORT  DATE OF PROCEDURE:  03/14/2018  PREOPERATIVE DIAGNOSIS:  Displaced right small metacarpal fracture.  POSTOPERATIVE DIAGNOSIS:  Displaced right small metacarpal fracture.  PROCEDURE:  Open reduction internal fixation of above using 1.5 mm 6-hole plate.  SURGEON:  Dairl PonderMatthew Linnie Delgrande, MD  ASSISTANT:  None.  ANESTHESIA:  General.  COMPLICATIONS:  No complications.  DRAINS:  No drains.  DESCRIPTION OF PROCEDURE:  The patient was taken to the operating suite after induction of adequate general anesthetic.  Right upper extremity was prepped and draped in the usual sterile fashion.  An Esmarch was used to exsanguinate the limb.  Tourniquet  was inflated to 250 mmHg.  At this point in time, incision was made dorsally over the small metacarpal and dissection was carried down to the interval between the [facility] and ED[facility] tendons.  We split this and dissected down to the fracture site.  The fracture  site was debrided of clot.  Reduction was performed with longitudinal traction downward pressure,  a 6-hole 1.5 mm plate was placed across the fracture site dorsally with, 3 screws above and 3 screws below the fracture.  Fluoroscopic imaging was used to  determine adequate plate position and determine reduction of the fracture and good placement of the hardware.  The wound was irrigated and loosely closed with 4-0 Vicryl to cover the plate and a 3-0 Prolene subcuticular stitch on the skin.   Steri-Strips, 4 x 4 fluffs and an ulnar gutter splint was applied.    The patient tolerated this procedure well and went to recovery room stable.  AN/NUANCE  D:03/14/2018 T:03/14/2018 JOB:[id number]

## 2018-03-14 NOTE — H&P (Signed)
Roger Lane is an 22 y.o. male.   Chief Complaint: Right hand pain and deformity HPI: Patient is a very pleasant 22 year old male right-hand-dominant status post right hand trauma with displaced fracture small metacarpal dominant right hand with rotational deformity  Past Medical History:  Diagnosis Date  . Fracture    right small metacarpal    History reviewed. No pertinent surgical history.  History reviewed. No pertinent family history. Social History:  reports that he has been smoking cigarettes. He has been smoking about 0.50 packs per day. He has never used smokeless tobacco. He reports that he drinks alcohol. He reports that he has current or past drug history. Drug: Marijuana.  Allergies: No Known Allergies  Medications Prior to Admission  Medication Sig Dispense Refill  . ibuprofen (ADVIL,MOTRIN) 200 MG tablet Take 400 mg by mouth every 6 (six) hours as needed for headache or moderate pain.      Results for orders placed or performed during the hospital encounter of 03/14/18 (from the past 48 hour(s))  Comprehensive metabolic panel     Status: Abnormal   Collection Time: 03/14/18  8:56 AM  Result Value Ref Range   Sodium 138 135 - 145 mmol/L   Potassium 3.7 3.5 - 5.1 mmol/L   Chloride 103 98 - 111 mmol/L   CO2 24 22 - 32 mmol/L   Glucose, Bld 102 (H) 70 - 99 mg/dL   BUN 15 6 - 20 mg/dL   Creatinine, Ser 5.621.03 0.61 - 1.24 mg/dL   Calcium 9.4 8.9 - 13.010.3 mg/dL   Total Protein 6.9 6.5 - 8.1 g/dL   Albumin 3.9 3.5 - 5.0 g/dL   AST 23 15 - 41 U/L   ALT 19 0 - 44 U/L   Alkaline Phosphatase 66 38 - 126 U/L   Total Bilirubin 0.5 0.3 - 1.2 mg/dL   GFR calc non Af Amer >60 >60 mL/min   GFR calc Af Amer >60 >60 mL/min   Anion gap 11 5 - 15    Comment: Performed at [facility], [address] [place], [place] [address]  Hemoglobin     Status: None   Collection Time: 03/14/18  8:56 AM  Result Value Ref Range   Hemoglobin 14.6 13.0 - 17.0 g/dL    Comment: Performed at  [facility], [address] [place], [place] [address]   No results found.  Review of Systems  All other systems reviewed and are negative.   Blood pressure 127/70, pulse 67, temperature (!) 97.4 F (36.3 C), temperature source Oral, resp. rate 18, height 6\' 2"  (1.88 m), weight 90.7 kg, SpO2 100 %. Physical Exam  Constitutional: He is oriented to person, place, and time. He appears well-developed and well-nourished.  HENT:  Head: Normocephalic.  Neck: Normal range of motion.  Cardiovascular: Normal rate.  Respiratory: Effort normal.  Musculoskeletal:       Right shoulder: He exhibits decreased range of motion, tenderness, bony tenderness and deformity.  Right hand pain, swelling, and rotational deformity small finger  Neurological: He is alert and oriented to person, place, and time.  Skin: Skin is warm.     Assessment/Plan 22 year old right-hand-dominant male with displaced small metacarpal fracture dominant right side.  Have discussed his current predicament and the patient wants to proceed with open reduction internal fixation right small metacarpal as an outpatient.  Patient understands risks and benefits and wishes to proceed.  Marlowe ShoresMatthew A Lorrena Goranson, MD 03/14/2018, 11:16 AM

## 2018-03-14 NOTE — Op Note (Signed)
Please see dictated report [phone]

## 2018-03-14 NOTE — Transfer of Care (Signed)
Immediate Anesthesia Transfer of Care Note  Patient: Roger Lane  Procedure(s) Performed: OPEN REDUCTION INTERNAL FIXATION RIGHT SMALL  METACARPAL FRACTURE (Right Hand)  Patient Location: PACU  Anesthesia Type:General  Level of Consciousness: awake, alert  and oriented  Airway & Oxygen Therapy: Patient Spontanous Breathing and Patient connected to nasal cannula oxygen  Post-op Assessment: Report given to RN, Post -op Vital signs reviewed and stable and Patient moving all extremities  Post vital signs: Reviewed and stable  Last Vitals:  Vitals Value Taken Time  BP 144/79 03/14/2018 12:37 PM  Temp    Pulse 90 03/14/2018 12:39 PM  Resp 21 03/14/2018 12:39 PM  SpO2 100 % 03/14/2018 12:39 PM  Vitals shown include unvalidated device data.  Last Pain:  Vitals:   03/14/18 0906  TempSrc:   PainSc: 0-No pain      Patients Stated Pain Goal: 0 (03/14/18 0906)  Complications: No apparent anesthesia complications

## 2018-03-16 ENCOUNTER — Encounter (HOSPITAL_COMMUNITY): Payer: Self-pay | Admitting: Orthopedic Surgery

## 2019-05-08 ENCOUNTER — Other Ambulatory Visit: Payer: Self-pay

## 2019-05-08 ENCOUNTER — Emergency Department (HOSPITAL_COMMUNITY)
Admission: EM | Admit: 2019-05-08 | Discharge: 2019-05-08 | Disposition: A | Discharge status: Home / Self Care | Payer: Self-pay | Attending: Emergency Medicine | Admitting: Emergency Medicine

## 2019-05-08 ENCOUNTER — Encounter (HOSPITAL_COMMUNITY): Payer: Self-pay | Admitting: Emergency Medicine

## 2019-05-08 DIAGNOSIS — K0889 Other specified disorders of teeth and supporting structures: Secondary | ICD-10-CM | POA: Insufficient documentation

## 2019-05-08 DIAGNOSIS — F17210 Nicotine dependence, cigarettes, uncomplicated: Secondary | ICD-10-CM | POA: Insufficient documentation

## 2019-05-08 MED ORDER — CLINDAMYCIN HCL 150 MG PO CAPS: 450.0000 mg | ORAL_CAPSULE | Freq: Three times a day (TID) | ORAL | 0 refills | Status: AC

## 2019-05-08 MED ORDER — IBUPROFEN 800 MG PO TABS: 800.0000 mg | ORAL_TABLET | Freq: Three times a day (TID) | ORAL | 0 refills | Status: AC | PRN

## 2019-05-08 NOTE — ED Provider Notes (Signed)
[facility] EMERGENCY DEPARTMENT Provider Note   CSN: [id number] Arrival date & time: 05/08/19  1424     History Chief Complaint  Patient presents with  . Dental Pain    Roger Lane is a 24 y.o. male with no significant PMH presents to the ED with a 3-day history of right lower dental pain.  Patient reports that he had initially tried taking Tylenol, with little success.  However he took ibuprofen yesterday which seemed to help.  He has a dentist appointment scheduled for 05/16/2019, however he was hoping he could maybe move that up because his birthday is 2 days later.  He is also concerned that he may have developed an abscess at the base of his tooth.  He denies any fevers or chills, headache or dizziness, significant facial swelling, neck pain or swallowing, difficulty eating or drinking, difficulty swallowing, trismus, drooling, or any other symptoms.  HPI     Past Medical History:  Diagnosis Date  . Fracture    right small metacarpal    There are no problems to display for this patient.   Past Surgical History:  Procedure Laterality Date  . OPEN REDUCTION INTERNAL FIXATION (ORIF) METACARPAL Right 03/14/2018   Procedure: OPEN REDUCTION INTERNAL FIXATION RIGHT SMALL  METACARPAL FRACTURE;  Surgeon: Dairl Ponder, MD;  Location: [facility] OR;  Service: Orthopedics;  Laterality: Right;       No family history on file.  Social History   Tobacco Use  . Smoking status: Current Every Day Smoker    Packs/day: 0.50    Types: Cigarettes  . Smokeless tobacco: Never Used  Substance Use Topics  . Alcohol use: Yes    Comment: social  . Drug use: Yes    Types: Marijuana    Comment: last use 03/13/18    Home Medications Prior to Admission medications   Medication Sig Start Date End Date Taking? Authorizing Provider  clindamycin (CLEOCIN) 150 MG capsule Take 3 capsules (450 mg total) by mouth 3 (three) times daily for 7 days. 05/08/19 05/15/19  Lorelee New, PA-C   ibuprofen (ADVIL) 800 MG tablet Take 1 tablet (800 mg total) by mouth every 8 (eight) hours as needed for moderate pain. 05/08/19   Lorelee New, PA-C    Allergies    Patient has no known allergies.  Review of Systems   Review of Systems  Constitutional: Negative for chills and fever.  HENT: Positive for dental problem. Negative for drooling, facial swelling, trouble swallowing and voice change.   Neurological: Negative for dizziness.    Physical Exam Updated Vital Signs BP 121/66 (BP Location: Right Arm)   Pulse 76   Temp 98.6 F (37 C) (Oral)   Resp 16   SpO2 100%   Physical Exam Vitals and nursing note reviewed. Exam conducted with a chaperone present.  Constitutional:      Appearance: Normal appearance.  HENT:     Head: Normocephalic and atraumatic.     Mouth/Throat:     Comments: Relatively poor dentition throughout.  Oropharynx patent, nonerythematous.  No uvular deviation.  No tongue swelling or floor of mouth induration.  Mild swelling and erythema noted at base of #30 tooth, which is necrotic.  No significant mass or area of fluctuance otherwise concerning for drainable abscess.  No facial swelling. Eyes:     General: No scleral icterus.    Conjunctiva/sclera: Conjunctivae normal.  Cardiovascular:     Rate and Rhythm: Normal rate and regular rhythm.  Pulses: Normal pulses.     Heart sounds: Normal heart sounds.  Pulmonary:     Effort: Pulmonary effort is normal. No respiratory distress.     Breath sounds: Normal breath sounds.  Musculoskeletal:     Cervical back: Normal range of motion and neck supple. No rigidity or tenderness.  Skin:    General: Skin is dry.  Neurological:     Mental Status: He is alert.     GCS: GCS eye subscore is 4. GCS verbal subscore is 5. GCS motor subscore is 6.  Psychiatric:        Mood and Affect: Mood normal.        Behavior: Behavior normal.        Thought Content: Thought content normal.      ED Results /  Procedures / Treatments   Labs (all labs ordered are listed, but only abnormal results are displayed) Labs Reviewed - No data to display  EKG None  Radiology No results found.  Procedures Procedures (including critical care time)  Medications Ordered in ED Medications - No data to display  ED Course  I have reviewed the triage vital signs and the nursing notes.  Pertinent labs & imaging results that were available during my care of the patient were reviewed by me and considered in my medical decision making (see chart for details).    MDM Rules/Calculators/A&P                      Patient has an eroded #30 tooth with dental extraction planned for 05/16/2019.  He presents to the ED with hopes to expedite his dental intervention so that he may eat and drink regularly for his birthday 05/18/2020.  He also is complaining of worsening pain symptoms in addition to concern for an abscess.  Physical exam demonstrated no evidence of a drainable abscess, albeit mild swelling and erythema at base of #30 tooth.  Will treat with clindamycin and prescribed ibuprofen and 900 mg 3 times daily as needed for his pain discomfort.  Patient will need follow-up with his dentist, as scheduled, for definitive intervention and management.  He denies any trismus, difficulty breathing, shortness of breath or wheezing, difficulty tolerating secretions, fevers or chills, or other history or physical exam findings concerning for large abscess that would otherwise require draining and possibly further imaging.  Patient is afebrile, hemodynamically stable, and in no acute distress on my examination.  Patient is understanding and agreeable to the plan.  Discussed return precautions, patient voices understanding.    Final Clinical Impression(s) / ED Diagnoses Final diagnoses:  Pain, dental    Rx / DC Orders ED Discharge Orders         Ordered    clindamycin (CLEOCIN) 150 MG capsule  3 times daily     05/08/19  1611    ibuprofen (ADVIL) 800 MG tablet  Every 8 hours PRN     05/08/19 1611           Corena Herter, PA-C 05/08/19 Astatula, Dan, DO 05/08/19 2007

## 2019-05-08 NOTE — ED Triage Notes (Signed)
Pt with right lower jaw pain and swelling. Reports feeling like he has an abscess.

## 2019-05-08 NOTE — ED Notes (Signed)
Patient verbalizes understanding of discharge instructions. Opportunity for questioning and answers were provided. Armband removed by staff, pt discharged from ED.  

## 2019-05-08 NOTE — Discharge Instructions (Signed)
Please go to your dental appointment on 05/16/2019 for dental extraction.    Please take your antibiotics as prescribed.  Please also take ibuprofen 800 mg 3 times a day as needed for your pain and discomfort.  Please return to the ED or seek immediate medical attention should you develop any fevers or chills, inability to open the mouth, drooling, difficulty swallowing or breathing, or any other new or worsening symptoms.

## 2019-07-30 IMAGING — DX DG HAND COMPLETE 3+V*R*
3 series · 3 of 3 positions shown · non-contrast
Comparison: None.

CLINICAL DATA: Injury playing basketball 4 days ago. Right hand
pain and swelling. Initial encounter.

EXAM:
RIGHT HAND - COMPLETE 3+ VIEW

[hand pa]
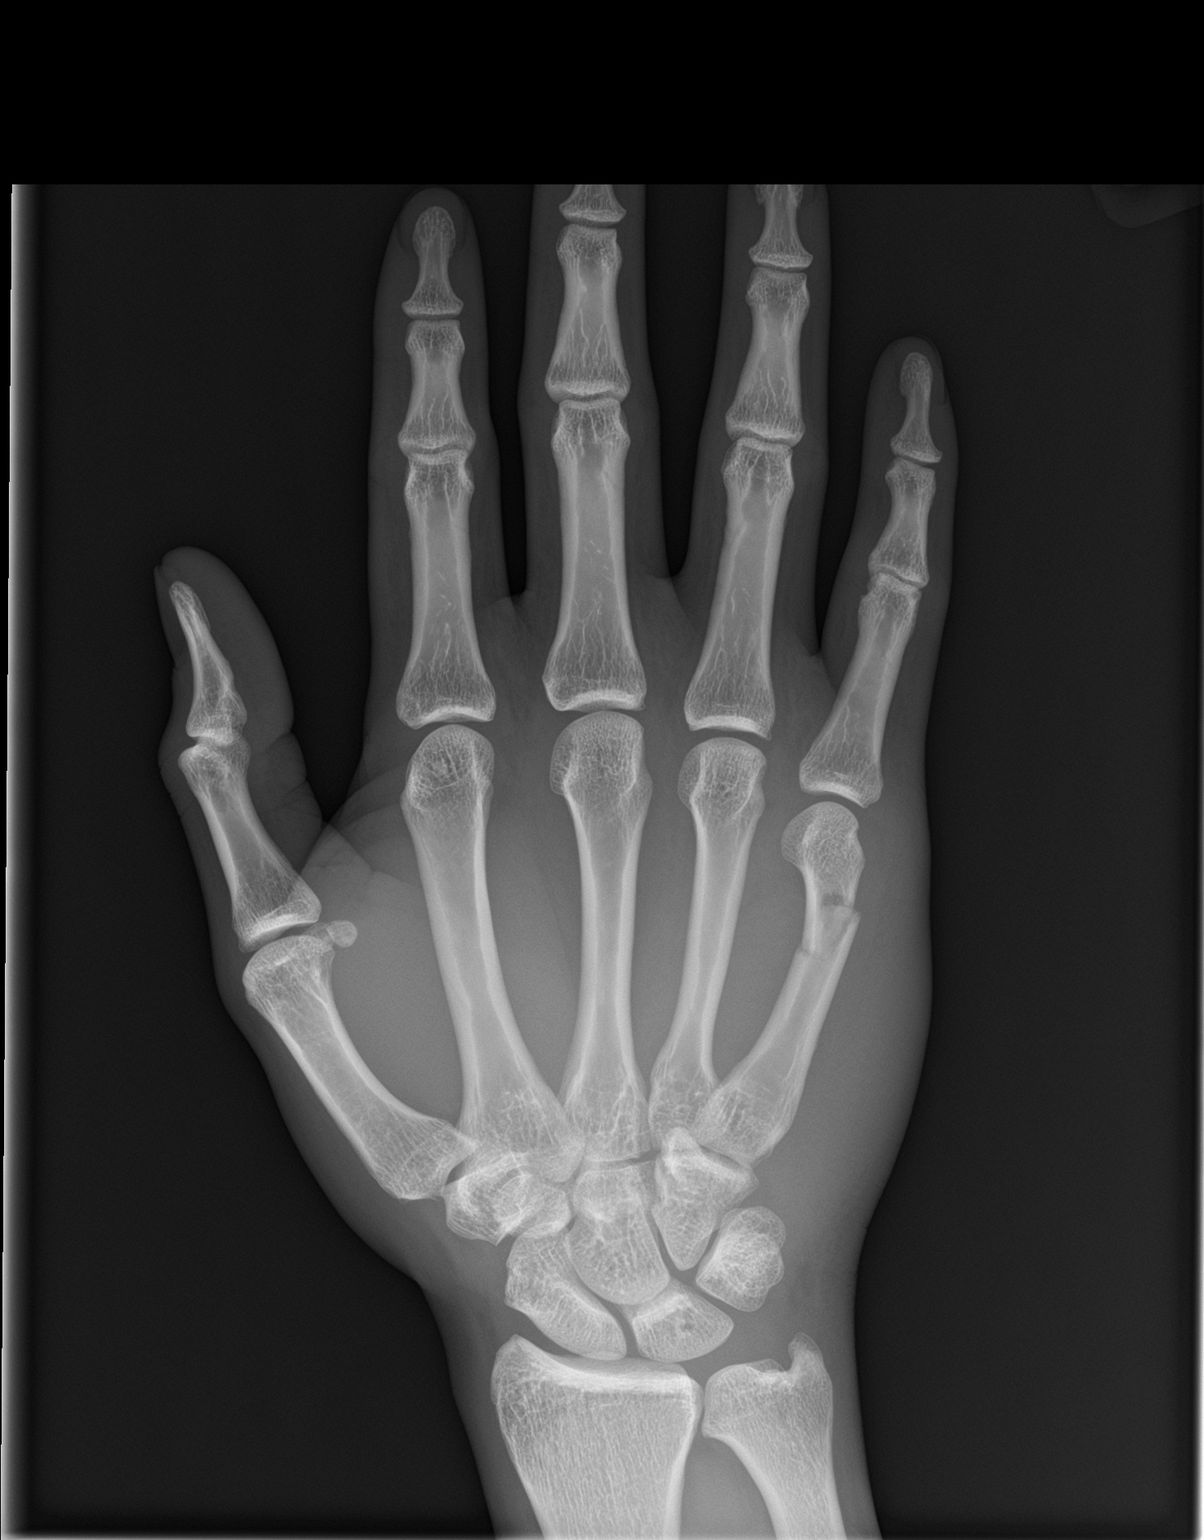

[hand obl]
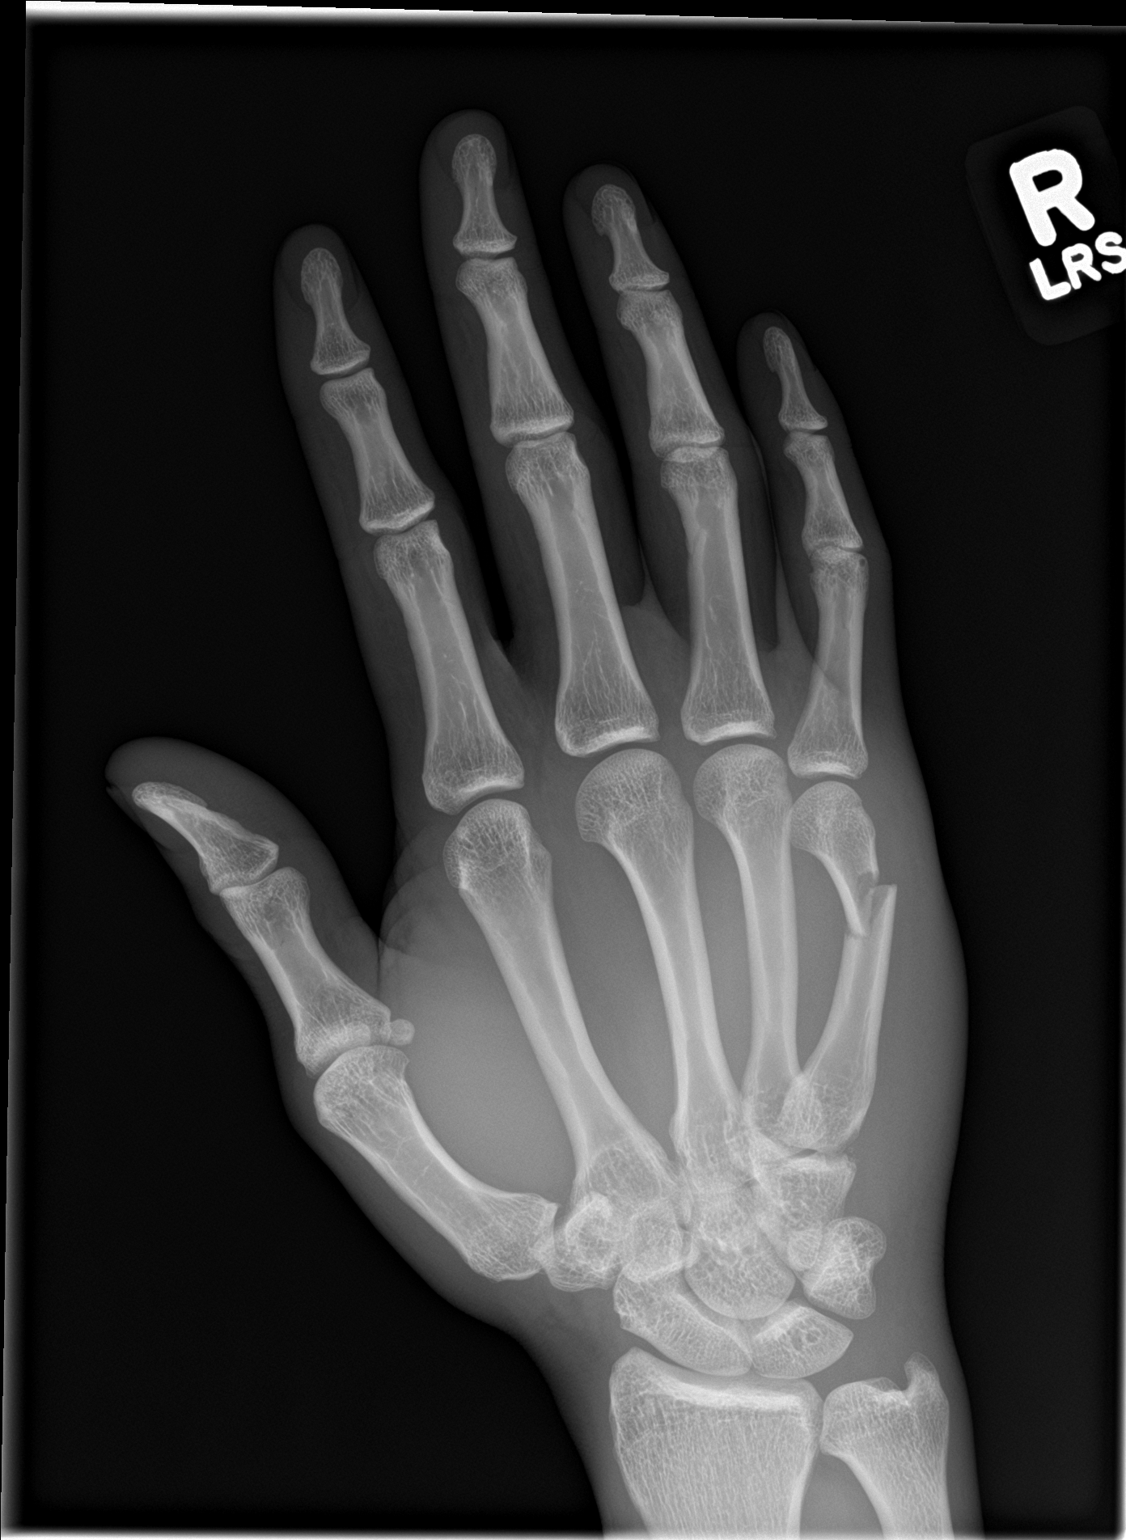

[hand lat]
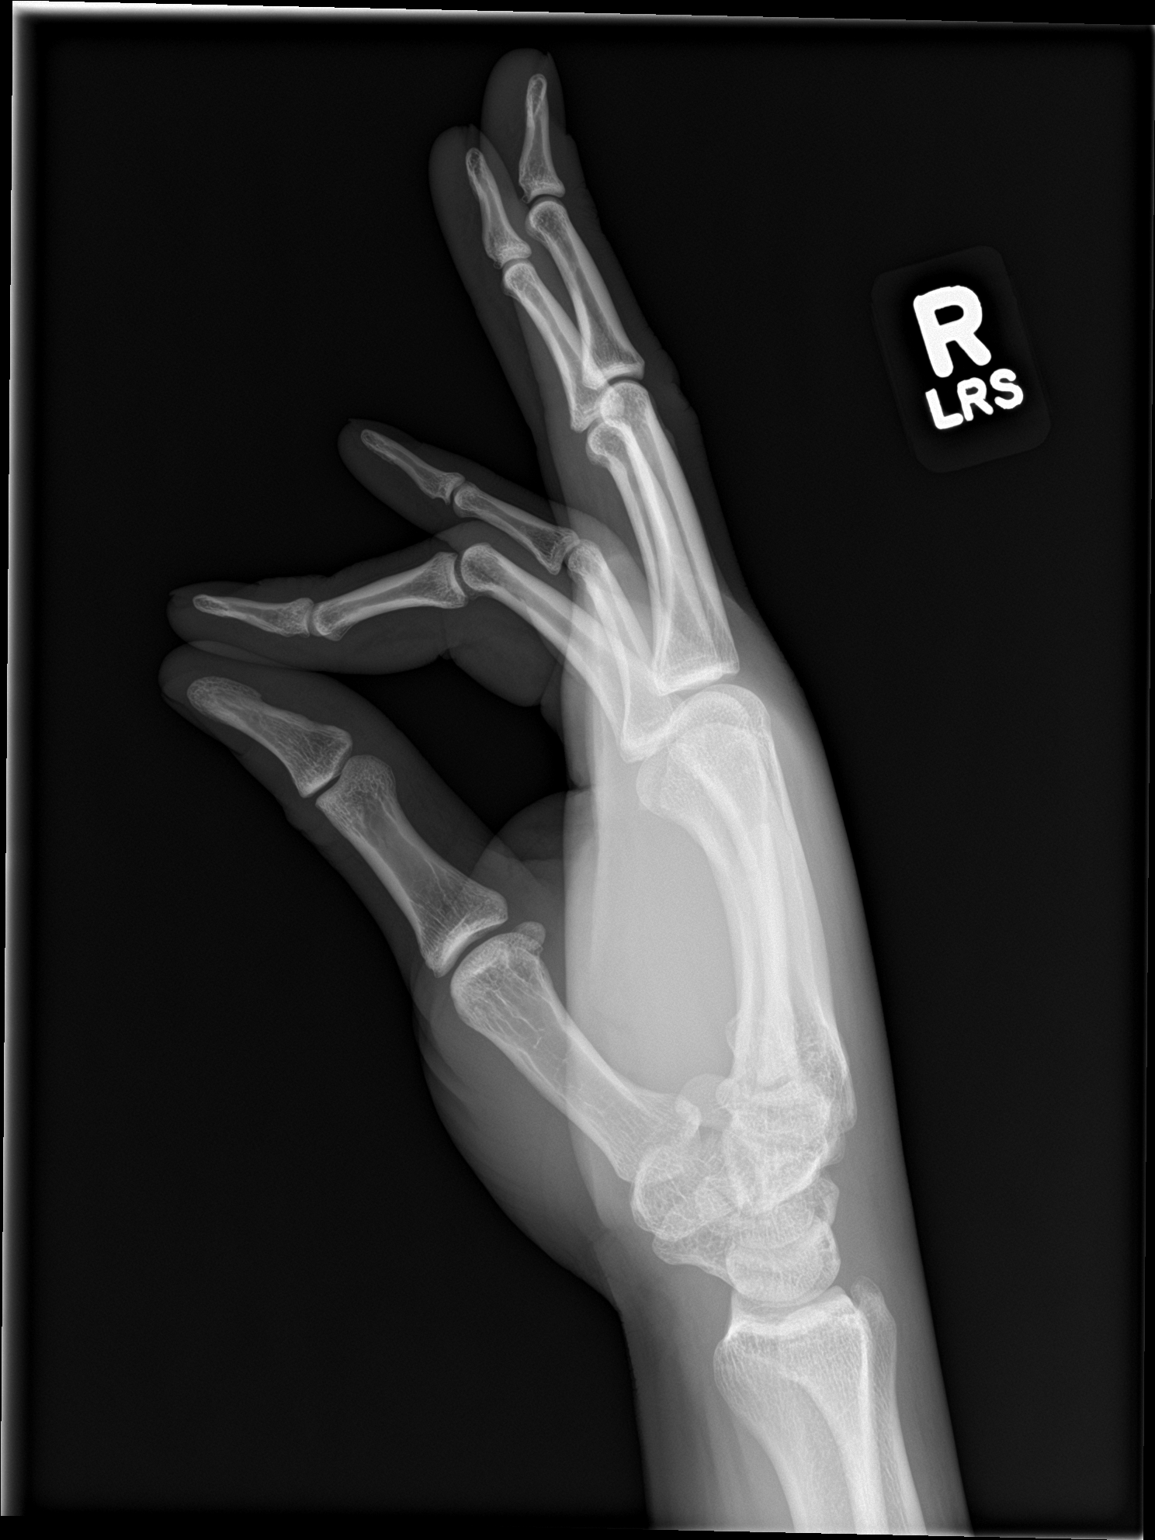

[3 of 3 positions shown; findings below may reference images not displayed]

FINDINGS: Oblique fracture of the distal 5th metacarpal is seen with mild
volar angulation of the distal fracture fragment. No other fractures
identified. No evidence of dislocation.
IMPRESSION: Distal 5th metacarpal fracture with mild volar angulation.
